# Patient Record
Sex: Male | Born: 1935 | Race: White | Hispanic: No | Marital: Married | Smoking: Never smoker
Health system: Southern US, Community
[De-identification: ages and names within clinical notes are randomized; demographics above are authoritative.]

## PROBLEM LIST (undated history)

## (undated) DIAGNOSIS — Z950 Presence of cardiac pacemaker: Secondary | ICD-10-CM

## (undated) DIAGNOSIS — E785 Hyperlipidemia, unspecified: Secondary | ICD-10-CM

## (undated) DIAGNOSIS — I10 Essential (primary) hypertension: Secondary | ICD-10-CM

## (undated) HISTORY — DX: Presence of cardiac pacemaker: Z95.0

---

## 2009-12-31 ENCOUNTER — Emergency Department (HOSPITAL_BASED_OUTPATIENT_CLINIC_OR_DEPARTMENT_OTHER): Admission: EM | Admit: 2009-12-31 | Discharge: 2009-12-31 | Payer: Self-pay | Admitting: Emergency Medicine

## 2009-12-31 ENCOUNTER — Ambulatory Visit: Payer: Self-pay | Admitting: Radiology

## 2017-06-11 ENCOUNTER — Emergency Department (HOSPITAL_BASED_OUTPATIENT_CLINIC_OR_DEPARTMENT_OTHER): Payer: Medicare HMO

## 2017-06-11 ENCOUNTER — Encounter (HOSPITAL_BASED_OUTPATIENT_CLINIC_OR_DEPARTMENT_OTHER): Payer: Self-pay | Admitting: Emergency Medicine

## 2017-06-11 ENCOUNTER — Emergency Department (HOSPITAL_BASED_OUTPATIENT_CLINIC_OR_DEPARTMENT_OTHER)
Admission: EM | Admit: 2017-06-11 | Discharge: 2017-06-11 | Disposition: A | Discharge status: Other Acute Inpatient Hospital | Payer: Medicare HMO | Attending: Emergency Medicine | Admitting: Emergency Medicine

## 2017-06-11 ENCOUNTER — Other Ambulatory Visit: Payer: Self-pay

## 2017-06-11 DIAGNOSIS — Z79899 Other long term (current) drug therapy: Secondary | ICD-10-CM | POA: Diagnosis not present

## 2017-06-11 DIAGNOSIS — Y9389 Activity, other specified: Secondary | ICD-10-CM | POA: Diagnosis not present

## 2017-06-11 DIAGNOSIS — R55 Syncope and collapse: Secondary | ICD-10-CM

## 2017-06-11 DIAGNOSIS — S80212A Abrasion, left knee, initial encounter: Secondary | ICD-10-CM | POA: Diagnosis not present

## 2017-06-11 DIAGNOSIS — I10 Essential (primary) hypertension: Secondary | ICD-10-CM | POA: Diagnosis not present

## 2017-06-11 DIAGNOSIS — Y92511 Restaurant or cafe as the place of occurrence of the external cause: Secondary | ICD-10-CM | POA: Insufficient documentation

## 2017-06-11 DIAGNOSIS — Y998 Other external cause status: Secondary | ICD-10-CM | POA: Diagnosis not present

## 2017-06-11 DIAGNOSIS — S0990XA Unspecified injury of head, initial encounter: Secondary | ICD-10-CM | POA: Diagnosis present

## 2017-06-11 DIAGNOSIS — S0083XA Contusion of other part of head, initial encounter: Secondary | ICD-10-CM

## 2017-06-11 DIAGNOSIS — W07XXXA Fall from chair, initial encounter: Secondary | ICD-10-CM | POA: Insufficient documentation

## 2017-06-11 DIAGNOSIS — S61411A Laceration without foreign body of right hand, initial encounter: Secondary | ICD-10-CM | POA: Diagnosis not present

## 2017-06-11 HISTORY — DX: Essential (primary) hypertension: I10

## 2017-06-11 HISTORY — DX: Hyperlipidemia, unspecified: E78.5

## 2017-06-11 LAB — CBC WITH DIFFERENTIAL/PLATELET
BASOS ABS: 0.1 10*3/uL (ref 0.0–0.1)
Basophils Relative: 1 %
EOS ABS: 0.5 10*3/uL (ref 0.0–0.7)
Eosinophils Relative: 6 %
HCT: 39.6 % (ref 39.0–52.0)
Hemoglobin: 13.2 g/dL (ref 13.0–17.0)
LYMPHS ABS: 1.1 10*3/uL (ref 0.7–4.0)
LYMPHS PCT: 14 %
MCH: 32.5 pg (ref 26.0–34.0)
MCHC: 33.3 g/dL (ref 30.0–36.0)
MCV: 97.5 fL (ref 78.0–100.0)
MONO ABS: 0.8 10*3/uL (ref 0.1–1.0)
Monocytes Relative: 10 %
NEUTROS PCT: 69 %
Neutro Abs: 5 10*3/uL (ref 1.7–7.7)
PLATELETS: 145 10*3/uL — AB (ref 150–400)
RBC: 4.06 MIL/uL — AB (ref 4.22–5.81)
RDW: 13.6 % (ref 11.5–15.5)
WBC: 7.5 10*3/uL (ref 4.0–10.5)

## 2017-06-11 LAB — COMPREHENSIVE METABOLIC PANEL
ALBUMIN: 4.1 g/dL (ref 3.5–5.0)
ALK PHOS: 58 U/L (ref 38–126)
ALT: 15 U/L — AB (ref 17–63)
ANION GAP: 5 (ref 5–15)
AST: 19 U/L (ref 15–41)
BUN: 21 mg/dL — ABNORMAL HIGH (ref 6–20)
CHLORIDE: 108 mmol/L (ref 101–111)
CO2: 28 mmol/L (ref 22–32)
CREATININE: 1.17 mg/dL (ref 0.61–1.24)
Calcium: 9.1 mg/dL (ref 8.9–10.3)
GFR calc non Af Amer: 56 mL/min — ABNORMAL LOW (ref >60)
GLUCOSE: 97 mg/dL (ref 65–99)
Potassium: 4 mmol/L (ref 3.5–5.1)
SODIUM: 141 mmol/L (ref 135–145)
Total Bilirubin: 1 mg/dL (ref 0.3–1.2)
Total Protein: 7.1 g/dL (ref 6.5–8.1)

## 2017-06-11 LAB — URINALYSIS, ROUTINE W REFLEX MICROSCOPIC
Bilirubin Urine: NEGATIVE
GLUCOSE, UA: NEGATIVE mg/dL
KETONES UR: NEGATIVE mg/dL
LEUKOCYTES UA: NEGATIVE
NITRITE: NEGATIVE
PROTEIN: NEGATIVE mg/dL
Specific Gravity, Urine: 1.015 (ref 1.005–1.030)
pH: 7 (ref 5.0–8.0)

## 2017-06-11 LAB — URINALYSIS, MICROSCOPIC (REFLEX)

## 2017-06-11 NOTE — ED Triage Notes (Addendum)
Patient reports "passing out" today while sitting at lunch.  States that he fell and injured his left hand, left side of face and head and left knee.  States he takes 1 low dose aspirin daily.  Fall was witnessed by neighbor who states patient lost consciousness for 2-3 minutes.

## 2017-06-11 NOTE — ED Notes (Signed)
[facility]; bed [facility] 605 (6S); [facility] will transport; pt/family updated.

## 2017-06-11 NOTE — ED Notes (Signed)
Report to Erskine SquibbJane on 6S at [facility].

## 2017-06-11 NOTE — ED Provider Notes (Signed)
[facility] EMERGENCY DEPARTMENT Provider Note   CSN: [id number] Arrival date & time: 06/11/17  1449     History   Chief Complaint Chief Complaint  Patient presents with  . Fall  . Loss of Consciousness    HPI Christian Eaton is a 83 y.o. male.  Patient is a 83 year old male with a history of hypertension and hyperlipidemia who presents after syncopal episode.  He states that he was sitting at a restaurant eating lunch and had a sudden syncopal event where he fell out of the chair and hit the left side of his head.  He states that he did not really feel the episode coming on.  He started to feel a little hazy just before it happened but he denies any dizziness.  No shortness of breath.  No palpitations.  No chest pain.  This was witnessed by a friend who reports that he was unconscious for about 2-3 minutes.  There is no seizure type activity.  He has some abrasions to the left face but he denies any facial tenderness.  No pain with chewing.  No neck or back pain.  He has been ambulating since the incident without symptoms.  He has had no recent illnesses.  No history of syncope in the past.  He also has a skin tear to his right hand and abrasion to his left knee.  He states his tetanus shot is up-to-date.      Past Medical History:  Diagnosis Date  . Hyperlipidemia   . Hypertension     There are no active problems to display for this patient.   History reviewed. No pertinent surgical history.     Home Medications    Prior to Admission medications   Medication Sig Start Date End Date Taking? Authorizing Provider  amLODipine (NORVASC) 5 MG tablet Take 5 mg by mouth daily.   Yes [provider]  atorvastatin (LIPITOR) 20 MG tablet Take 20 mg by mouth daily.   Yes [provider]  benazepril (LOTENSIN) 10 MG tablet Take 10 mg by mouth daily.   Yes [provider]  bimatoprost (LUMIGAN) 0.03 % ophthalmic solution 1 drop at bedtime.   Yes  [provider]  calcium carbonate (CALCIUM 600) 600 MG TABS tablet Take 600 mg by mouth 2 (two) times daily with a meal.   Yes [provider]  dorzolamide (TRUSOPT) 2 % ophthalmic solution 1 drop 3 (three) times daily.   Yes [provider]  levothyroxine (SYNTHROID, LEVOTHROID) 75 MCG tablet Take 75 mcg by mouth daily before breakfast.   Yes [provider]  Multiple Vitamin (MULTIVITAMIN) tablet Take 1 tablet by mouth daily.   Yes [provider]  ranitidine (ZANTAC) 150 MG tablet Take 150 mg by mouth 2 (two) times daily.   Yes [provider]    Family History History reviewed. No pertinent family history.  Social History Social History   Tobacco Use  . Smoking status: Never Smoker  . Smokeless tobacco: Never Used  Substance Use Topics  . Alcohol use: Yes    Frequency: Never  . Drug use: No     Allergies   Patient has no known allergies.   Review of Systems Review of Systems  Constitutional: Negative for chills, diaphoresis, fatigue and fever.  HENT: Negative for congestion, rhinorrhea and sneezing.   Eyes: Negative.   Respiratory: Negative for cough, chest tightness and shortness of breath.   Cardiovascular: Negative for chest pain and leg swelling.  Gastrointestinal: Negative for abdominal pain, blood in stool, diarrhea, nausea and vomiting.  Genitourinary: Negative for difficulty urinating, flank pain, frequency and hematuria.  Musculoskeletal: Negative for arthralgias and back pain.  Skin: Positive for wound. Negative for rash.  Neurological: Positive for syncope. Negative for dizziness, speech difficulty, weakness, numbness and headaches.     Physical Exam Updated Vital Signs BP (!) 146/64   Pulse (!) 56   Temp 97.8 F (36.6 C) (Oral)   Resp 14   Ht 5\' 11"  (1.803 m)   Wt 88.5 kg (195 lb)   SpO2 99%   BMI 27.20 kg/m   Physical Exam  Constitutional: He is oriented to person, place, and time. He  appears well-developed and well-nourished.  HENT:  Head: Normocephalic.  Patient has some abrasions to the left face but there is no underlying bony tenderness.  No malocclusion to the jaw.  The TMs appear intact.  Eyes: Conjunctivae and EOM are normal. Pupils are equal, round, and reactive to light.  Neck: Normal range of motion. Neck supple.  No pain to the cervical thoracic or lumbosacral spine  Cardiovascular: Normal rate, regular rhythm and normal heart sounds.  Pulmonary/Chest: Effort normal and breath sounds normal. No respiratory distress. He has no wheezes. He has no rales. He exhibits no tenderness.  Abdominal: Soft. Bowel sounds are normal. There is no tenderness. There is no rebound and no guarding.  Musculoskeletal: Normal range of motion. He exhibits no edema.  No pain on palpation or range of motion of the extremities  Lymphadenopathy:    He has no cervical adenopathy.  Neurological: He is alert and oriented to person, place, and time.  Motor 5/5 all extremities Sensation grossly intact to LT all extremities Finger to Nose intact, no pronator drift CN II-XII grossly intact    Skin: Skin is warm and dry. No rash noted.  Patient has a skin tear to his right dorsal surface of his hand.  He also has a small abrasion to his left knee without underlying bony tenderness.  Psychiatric: He has a normal mood and affect.     ED Treatments / Results  Labs (all labs ordered are listed, but only abnormal results are displayed) Labs Reviewed  COMPREHENSIVE METABOLIC PANEL - Abnormal; Notable for the following components:      Result Value   BUN 21 (*)    ALT 15 (*)    GFR calc non Af Amer 56 (*)    All other components within normal limits  CBC WITH DIFFERENTIAL/PLATELET - Abnormal; Notable for the following components:   RBC 4.06 (*)    Platelets 145 (*)    All other components within normal limits  URINALYSIS, ROUTINE W REFLEX MICROSCOPIC - Abnormal; Notable for the  following components:   Hgb urine dipstick SMALL (*)    All other components within normal limits  URINALYSIS, MICROSCOPIC (REFLEX) - Abnormal; Notable for the following components:   Bacteria, UA RARE (*)    Squamous Epithelial / LPF 0-5 (*)    All other components within normal limits    EKG  EKG Interpretation  Date/Time:  Saturday June 11 2017 15:56:04 EST Ventricular Rate:  58 PR Interval:    QRS Duration: 94 QT Interval:  407 QTC Calculation: 400 R Axis:   48 Text Interpretation:  Sinus rhythm Borderline prolonged PR interval Baseline wander in lead(s) V3 No old tracing to compare Confirmed by Rolan BuccoBelfi, Jordy Verba [phone]([id number]) on 06/11/2017 4:14:05 PM       Radiology  Ct Head Wo Contrast  Result Date: 06/11/2017 CLINICAL DATA:  Syncopal episode and fall today. Hit left head and neck. Pain. Initial encounter. EXAM: CT HEAD WITHOUT CONTRAST CT CERVICAL SPINE WITHOUT CONTRAST TECHNIQUE: Multidetector CT imaging of the head and cervical spine was performed following the standard protocol without intravenous contrast. Multiplanar CT image reconstructions of the cervical spine were also generated. COMPARISON:  None. FINDINGS: CT HEAD FINDINGS Brain: No evidence of acute infarction, hemorrhage, hydrocephalus, extra-axial collection, or mass lesion/mass effect. Mild cerebral atrophy and chronic small vessel disease. Vascular:  No hyperdense vessel or other acute findings. Skull: No evidence of fracture or other significant bone abnormality. Sinuses/Orbits:  No acute findings. Other: None. CT CERVICAL SPINE FINDINGS Alignment: Normal. Skull base and vertebrae: No acute fracture. No primary bone lesion or focal pathologic process. Soft tissues and spinal canal: No prevertebral fluid or swelling. No visible canal hematoma. Disc levels: Mild degenerative disc disease seen from levels of C4 to C7. Atlantoaxial degenerative changes also seen. Upper chest: No acute findings. Other: None. IMPRESSION: No  acute intracranial abnormality. Mild cerebral atrophy and chronic small vessel disease. No evidence of cervical spine fracture or subluxation. Degenerative spondylosis, as described above. Electronically Signed   By: Myles Rosenthal M.D.   On: 06/11/2017 16:51   Ct Cervical Spine Wo Contrast  Result Date: 06/11/2017 CLINICAL DATA:  Syncopal episode and fall today. Hit left head and neck. Pain. Initial encounter. EXAM: CT HEAD WITHOUT CONTRAST CT CERVICAL SPINE WITHOUT CONTRAST TECHNIQUE: Multidetector CT imaging of the head and cervical spine was performed following the standard protocol without intravenous contrast. Multiplanar CT image reconstructions of the cervical spine were also generated. COMPARISON:  None. FINDINGS: CT HEAD FINDINGS Brain: No evidence of acute infarction, hemorrhage, hydrocephalus, extra-axial collection, or mass lesion/mass effect. Mild cerebral atrophy and chronic small vessel disease. Vascular:  No hyperdense vessel or other acute findings. Skull: No evidence of fracture or other significant bone abnormality. Sinuses/Orbits:  No acute findings. Other: None. CT CERVICAL SPINE FINDINGS Alignment: Normal. Skull base and vertebrae: No acute fracture. No primary bone lesion or focal pathologic process. Soft tissues and spinal canal: No prevertebral fluid or swelling. No visible canal hematoma. Disc levels: Mild degenerative disc disease seen from levels of C4 to C7. Atlantoaxial degenerative changes also seen. Upper chest: No acute findings. Other: None. IMPRESSION: No acute intracranial abnormality. Mild cerebral atrophy and chronic small vessel disease. No evidence of cervical spine fracture or subluxation. Degenerative spondylosis, as described above. Electronically Signed   By: Myles Rosenthal M.D.   On: 06/11/2017 16:51    Procedures Procedures (including critical care time)  Medications Ordered in ED Medications - No data to display   Initial Impression / Assessment and Plan / ED  Course  I have reviewed the triage vital signs and the nursing notes.  Pertinent labs & imaging results that were available during my care of the patient were reviewed by me and considered in my medical decision making (see chart for details).     Patient presents after a syncopal episode.  There is no significant preceding symptoms.  He has some abrasions to his face but there is no underlying bony tenderness I did not feel that imaging of his facial bones was indicated.  Head CT is negative for acute abnormality.  CT of the cervical spine is negative for acute abnormality.  There is no other apparent injuries.  His tetanus shot is up-to-date.  Given the syncopal event without preceding symptoms,  I feel that he should be admitted for monitoring.  I spoke with [facility], Dr. Gaspar BiddingMatch, who has accepted the patient for transfer.  Final Clinical Impressions(s) / ED Diagnoses   Final diagnoses:  Syncope and collapse  Contusion of face, initial encounter    ED Discharge Orders    None       Rolan BuccoBelfi, Tanvi Gatling, MD 06/11/17 [phone]

## 2017-06-11 NOTE — ED Notes (Signed)
[facility]

## 2017-06-11 NOTE — ED Notes (Addendum)
Alert, NAD, calm, interactive, resps e/u, speaking in clear complete sentences, no dyspnea noted, skin W&D, VSS, resting comfortably, no complaints at this time, (denies: pain, palpitations, fluttering, numbness , tingling, sob, nausea, dizziness or visual changes). Snack given, pending transport to [facility]. [facility] NSR 60 on monitor.

## 2017-06-11 NOTE — ED Notes (Signed)
Patient transported to X-ray 

## 2017-06-11 NOTE — ED Notes (Signed)
No changes, updated, denies sx, [facility], pending imminent arrival of [facility] transport.

## 2018-08-13 IMAGING — CT CT HEAD W/O CM
3 of 7 series · 13 of 47 positions shown, 15 images · non-contrast
Comparison: None.

CLINICAL DATA: Syncopal episode and fall today. Hit left head and
neck. Pain. Initial encounter.

EXAM:
CT HEAD WITHOUT CONTRAST
CT CERVICAL SPINE WITHOUT CONTRAST
TECHNIQUE: Multidetector CT imaging of the head and cervical spine was
performed following the standard protocol without intravenous
contrast. Multiplanar CT image reconstructions of the cervical spine
were also generated.

[Series 4: head 3.0 mpr cor · coronal · 0.38mm/px · 3 of 70 slices shown]
[im 9/70  brain]
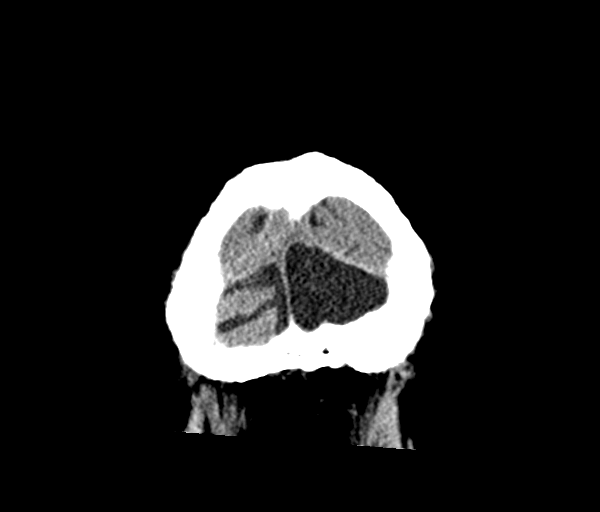
[im 17/70  brain]
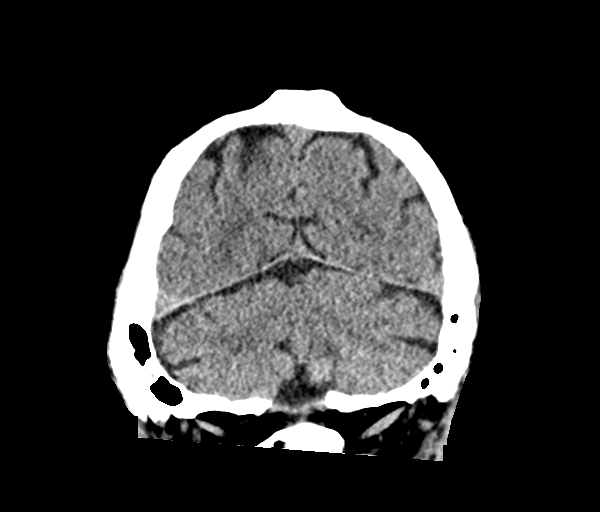
[im 25/70  brain]
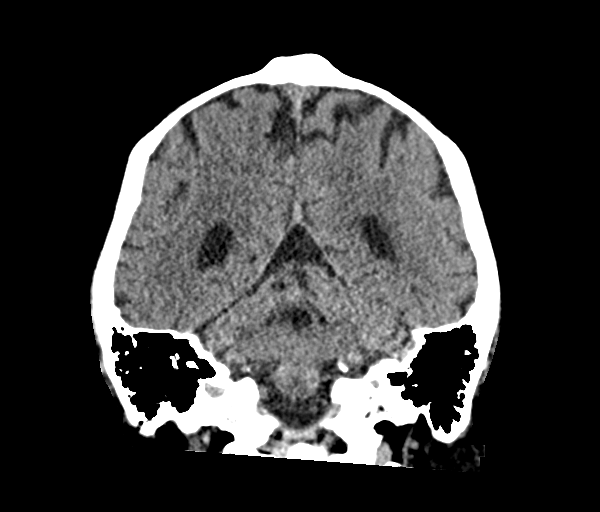

[Series 10: sagittals · sagittal · 0.27mm/px · 2 of 64 slices shown]
[im 22/64  brain]
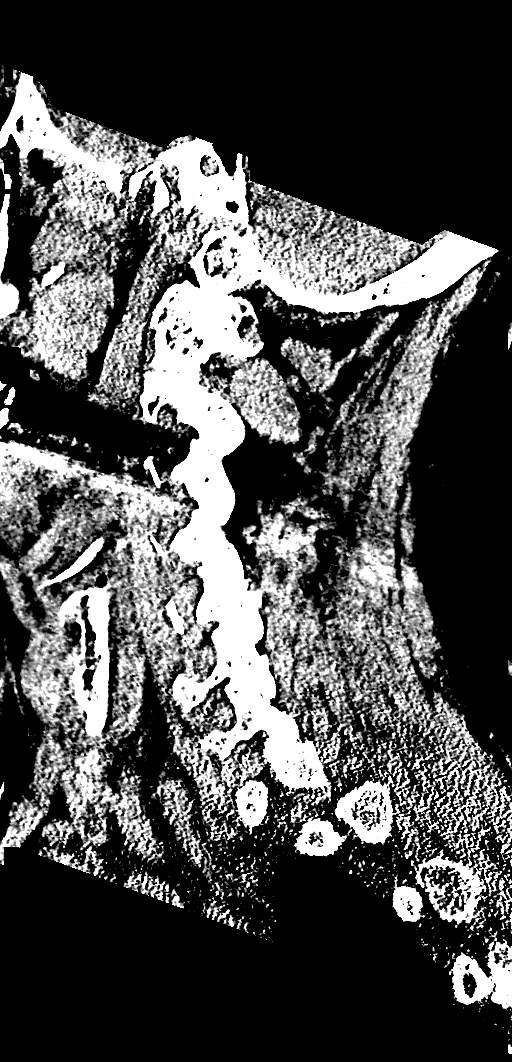
[im 43/64  brain]
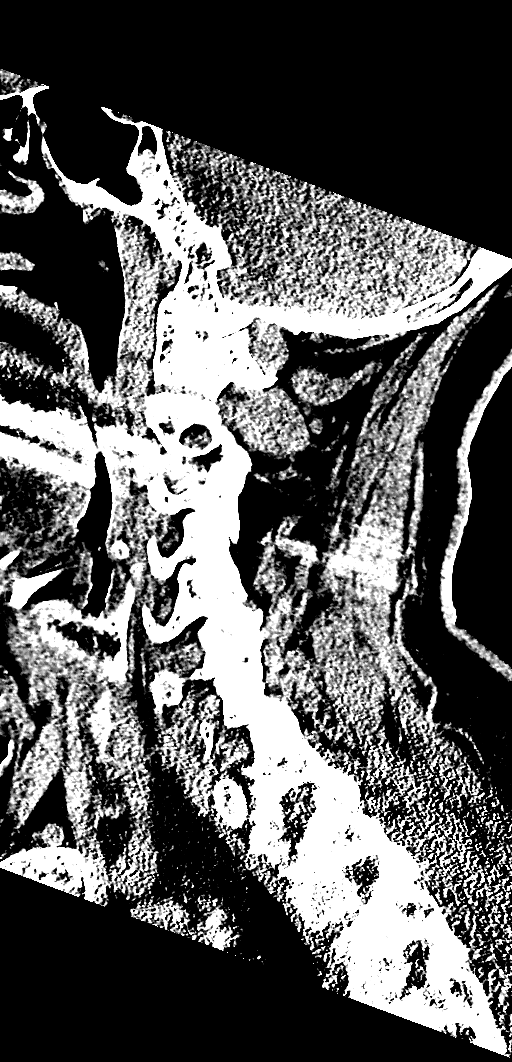

[Series 11: orthogonals · axial · 0.31mm/px · z∈[-370,-203]mm · 8 of 107 slices shown, 10 images]
[im 9/107  brain]
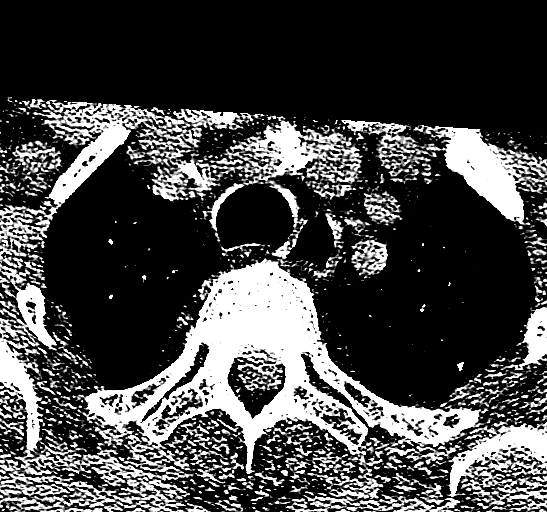
[im 9/107  bone]
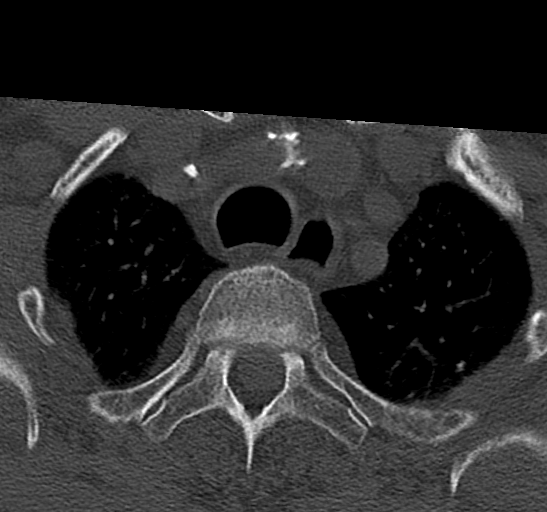
[im 27/107  brain]
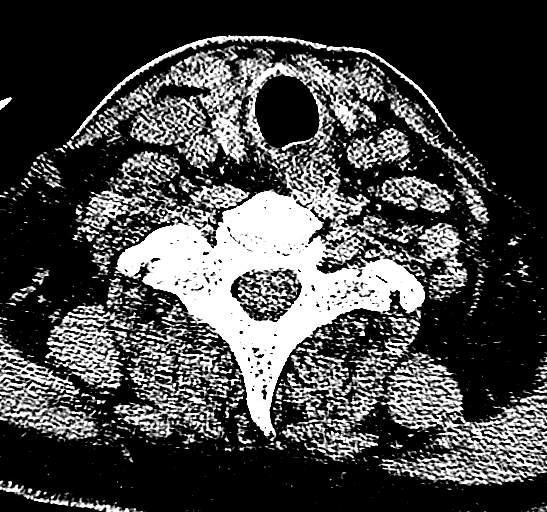
[im 36/107  brain]
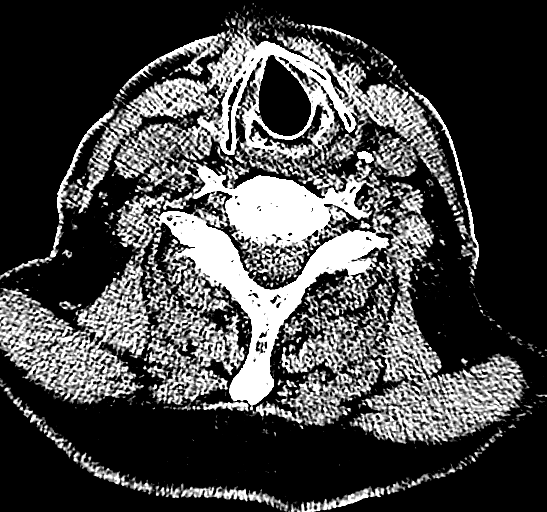
[im 45/107  brain]
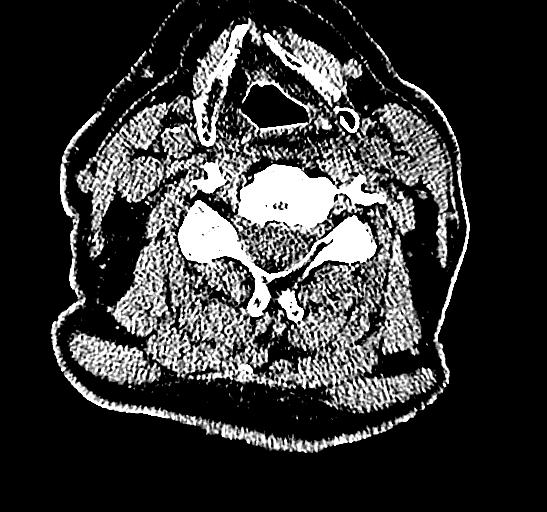
[im 62/107  brain]
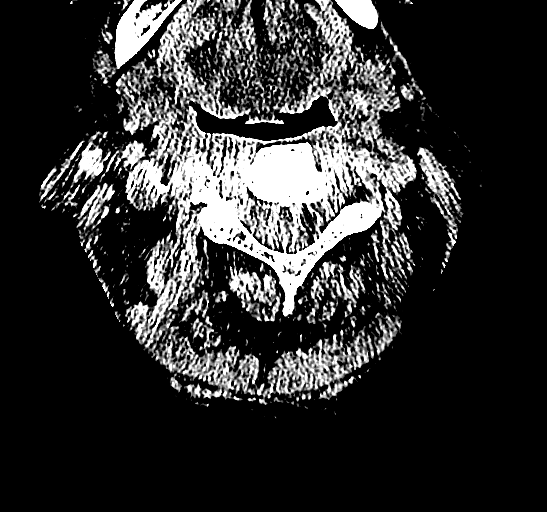
[im 62/107  bone]
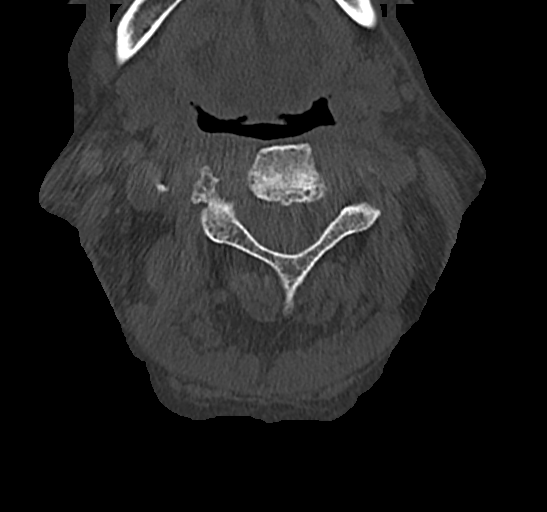
[im 71/107  brain]
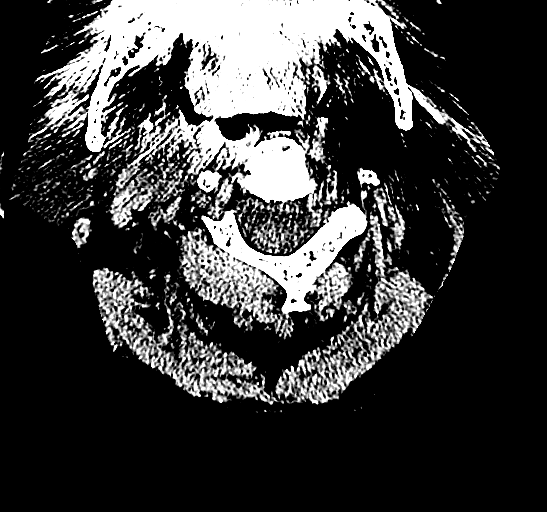
[im 80/107  brain]
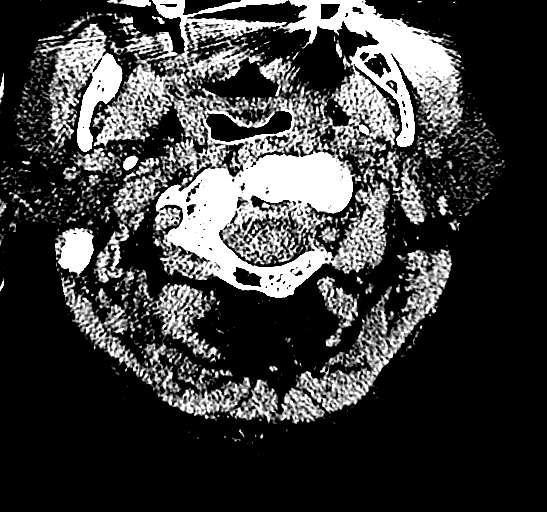
[im 98/107  brain]
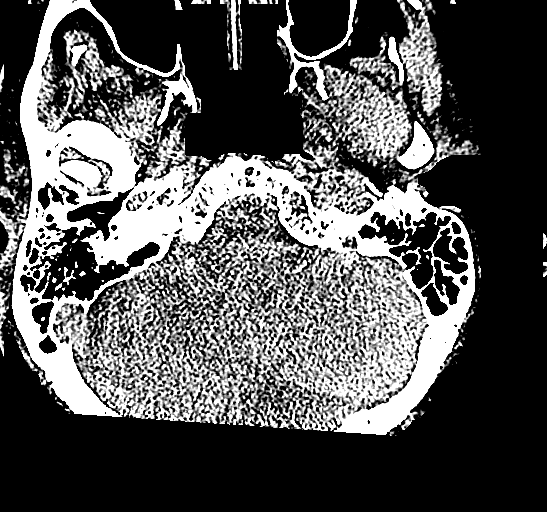

[13 of 47 positions shown; findings below may reference images not displayed]

FINDINGS: CT HEAD FINDINGS

Brain: No evidence of acute infarction, hemorrhage, hydrocephalus,
extra-axial collection, or mass lesion/mass effect. Mild cerebral
atrophy and chronic small vessel disease.

Vascular:  No hyperdense vessel or other acute findings.

Skull: No evidence of fracture or other significant bone
abnormality.

Sinuses/Orbits:  No acute findings.

Other: None.

CT CERVICAL SPINE FINDINGS

Alignment: Normal.

Skull base and vertebrae: No acute fracture. No primary bone lesion
or focal pathologic process.

Soft tissues and spinal canal: No prevertebral fluid or swelling. No
visible canal hematoma.

Disc levels: Mild degenerative disc disease seen from levels of C4
to C7. Atlantoaxial degenerative changes also seen.

Upper chest: No acute findings.

Other: None.
IMPRESSION: No acute intracranial abnormality. Mild cerebral atrophy and chronic
small vessel disease.

No evidence of cervical spine fracture or subluxation. Degenerative
spondylosis, as described above.

## 2020-03-06 ENCOUNTER — Emergency Department (HOSPITAL_BASED_OUTPATIENT_CLINIC_OR_DEPARTMENT_OTHER)
Admission: EM | Admit: 2020-03-06 | Discharge: 2020-03-06 | Disposition: A | Discharge status: Home / Self Care | Payer: Medicare HMO | Attending: Emergency Medicine | Admitting: Emergency Medicine

## 2020-03-06 ENCOUNTER — Encounter (HOSPITAL_BASED_OUTPATIENT_CLINIC_OR_DEPARTMENT_OTHER): Payer: Self-pay | Admitting: Emergency Medicine

## 2020-03-06 ENCOUNTER — Other Ambulatory Visit: Payer: Self-pay

## 2020-03-06 DIAGNOSIS — M25531 Pain in right wrist: Secondary | ICD-10-CM | POA: Diagnosis not present

## 2020-03-06 DIAGNOSIS — I10 Essential (primary) hypertension: Secondary | ICD-10-CM | POA: Insufficient documentation

## 2020-03-06 DIAGNOSIS — Z79899 Other long term (current) drug therapy: Secondary | ICD-10-CM | POA: Diagnosis not present

## 2020-03-06 DIAGNOSIS — Z79890 Hormone replacement therapy (postmenopausal): Secondary | ICD-10-CM | POA: Diagnosis not present

## 2020-03-06 DIAGNOSIS — M25532 Pain in left wrist: Secondary | ICD-10-CM | POA: Insufficient documentation

## 2020-03-06 HISTORY — DX: Presence of cardiac pacemaker: Z95.0

## 2020-03-06 NOTE — ED Provider Notes (Signed)
[facility] EMERGENCY DEPARTMENT Provider Note   CSN: [id number] Arrival date & time: 03/06/20  1005     History Chief Complaint  Patient presents with  . Wrist Pain    Christian Eaton is a 86 y.o. male presents to the ED today for evaluation of bilateral wrist pain that began on Thursday.  Pain seems to be worse in the right wrist.  He is right-hand dominant.  States the right wrist has given him some problem in the past but to a milder degree.  He points to the palmar aspect of his wrists.  He has pain with bending and movement of his thumbs.  Feels like his grip is weaker at the foam due to the pain.  It is making it hard for him to clothe himself.  This morning he woke up and noticed tingling in the right thumb that went away.  Feels like his wrists are slightly more warm than the rest of his hands.  Took some Tylenol but was afraid to take it for too many days at a time.  Unsure if this helped him.  Reports history of gout in the right toe many years ago but never in the wrists.  Denies any trauma.  He goes to the gym and uses the weight machines twice a week but this is not a new activity.  Denies any recent falling, increase rest movement or activity.  No fevers.  HPI     Past Medical History:  Diagnosis Date  . Hyperlipidemia   . Hypertension   . Pacemaker     There are no problems to display for this patient.   History reviewed. No pertinent surgical history.     No family history on file.  Social History   Tobacco Use  . Smoking status: Never Smoker  . Smokeless tobacco: Never Used  Substance Use Topics  . Alcohol use: Yes  . Drug use: No    Home Medications Prior to Admission medications   Medication Sig Start Date End Date Taking? Authorizing Provider  amLODipine (NORVASC) 5 MG tablet Take 5 mg by mouth daily.    [provider]  atorvastatin (LIPITOR) 20 MG tablet Take 20 mg by mouth daily.    [provider]  benazepril  (LOTENSIN) 10 MG tablet Take 10 mg by mouth daily.    [provider]  bimatoprost (LUMIGAN) 0.03 % ophthalmic solution 1 drop at bedtime.    [provider]  calcium carbonate (CALCIUM 600) 600 MG TABS tablet Take 600 mg by mouth 2 (two) times daily with a meal.    [provider]  dorzolamide (TRUSOPT) 2 % ophthalmic solution 1 drop 3 (three) times daily.    [provider]  levothyroxine (SYNTHROID, LEVOTHROID) 75 MCG tablet Take 75 mcg by mouth daily before breakfast.    [provider]  Multiple Vitamin (MULTIVITAMIN) tablet Take 1 tablet by mouth daily.    [provider]  ranitidine (ZANTAC) 150 MG tablet Take 150 mg by mouth 2 (two) times daily.    [provider]    Allergies    Patient has no known allergies.  Review of Systems   Review of Systems  Musculoskeletal: Positive for arthralgias.  Neurological:       Tingling resolved  All other systems reviewed and are negative.   Physical Exam Updated Vital Signs BP (!) 140/53 (BP Location: Left Arm)   Pulse (!) 54   Temp 97.8 F (36.6 C) (Oral)  Resp 18   Ht 5\' 10"  (1.778 m)   Wt 89.8 kg   SpO2 100%   BMI 28.41 kg/m   Physical Exam Vitals and nursing note reviewed.  Constitutional:      General: He is not in acute distress.    Appearance: He is well-developed.     Comments: NAD.  HENT:     Head: Normocephalic and atraumatic.     Right Ear: External ear normal.     Left Ear: External ear normal.     Nose: Nose normal.  Eyes:     General: No scleral icterus.    Conjunctiva/sclera: Conjunctivae normal.  Cardiovascular:     Rate and Rhythm: Normal rate and regular rhythm.     Heart sounds: Normal heart sounds. No murmur heard.      Comments: Distal finger tips and radial pulses intact bilaterally  Pulmonary:     Effort: Pulmonary effort is normal.     Breath sounds: Normal breath sounds. No wheezing.  Musculoskeletal:        General: No  deformity. Normal range of motion.     Cervical back: Normal range of motion and neck supple.     Comments: Wrist exam: mild bilateral carpal tunnel tenderness.  Positive Phalen's (pain, no paresthesias).  Negative Tinel's.  Pain with thumb extension and abduction, bilaterally (R worse than L).  Able to oppose thumbs to all fingers. No thumb or hand grip weakness.  No scaphoid or distal radial/ulnar tenderness. Compartments of forearms soft. No joint erythema, warmth, fluctuance.    Skin:    General: Skin is warm and dry.     Capillary Refill: Capillary refill takes less than 2 seconds.  Neurological:     Mental Status: He is alert and oriented to person, place, and time.     Comments: Sensation and strength intact in radial, medial, ulnar distribution. No thumb or wrist weakness bilaterally   Psychiatric:        Behavior: Behavior normal.        Thought Content: Thought content normal.        Judgment: Judgment normal.     ED Results / Procedures / Treatments   Labs (all labs ordered are listed, but only abnormal results are displayed) Labs Reviewed - No data to display  EKG None  Radiology No results found.  Procedures Procedures (including critical care time)  Medications Ordered in ED Medications - No data to display  ED Course  I have reviewed the triage vital signs and the nursing notes.  Pertinent labs & imaging results that were available during my care of the patient were reviewed by me and considered in my medical decision making (see chart for details).    MDM Rules/Calculators/A&P                          86 yo pleasant male presents for bilateral atraumatic wrist pain, right greater than left. He is RHD. History of gout in toe only.  Some tingling in right thumb this morning.   Suspect soft tissue overuse or inflammatory process like carpal tunnel syndrome, tendinitis, arthritis.   Offered patient bilateral wrist x-rays but ultimately he deferred. Reasonable  as he has had no trauma.  Would not change management.   Discussed tylenol every 8 hours, ice, activity modification and night time splint.  Recommended follow up with PCP or orthopedics in in 7-10 days if symptoms not improving. May need nerve studies,  x-rays, etc.  Return precautions given. He is in agreement with plan.  Final Clinical Impression(s) / ED Diagnoses Final diagnoses:  Bilateral wrist pain    Rx / DC Orders ED Discharge Orders    None       Liberty Handy, PA-C 03/06/20 1207    Virgina Norfolk, DO 03/06/20 1256

## 2020-03-06 NOTE — Discharge Instructions (Addendum)
Your wrist pain is likely from soft tissue inflammation. Possibly nerve or tendon inflammation, or arthritis, or both  Take 500-650 mg acetaminophen (tylenol) every 8 hours for the next 5 days.  Ice for 15-20 min every 6-8 hours (at least 3-4 times a day). Modify your activities, avoid repetitive bending of the wrist for at least 3-5 days and until pain improves.  Wear the wrist braces at night time. Can use during the day as well as needed and as allowed.  Return to ER increased joint swelling redness warmth fevers chills  Follow up with primary care doctor in 7-10 days if symptoms have not improved, you may need referral to hand surgeon/specialist if symptoms are severe  You can also contact Dr Clare Gandy who is a family medicine and orthopedist doctor here in [facility].  See contact information below

## 2020-03-06 NOTE — ED Triage Notes (Signed)
Bilateral wrist pain since last week. No known injury.

## 2020-03-12 ENCOUNTER — Telehealth: Payer: Self-pay | Admitting: Family Medicine

## 2020-03-12 NOTE — Telephone Encounter (Signed)
Called pt to offer f/u care appt for ( Chronic gout of right wrist ) per wife he is unavailable --ask that we call back tomorrow.  --glh

## 2022-10-26 ENCOUNTER — Encounter: Payer: Self-pay | Admitting: *Deleted
